# Patient Record
Sex: Female | Born: 2000 | Race: Black or African American | Hispanic: No | Marital: Single | Smoking: Never smoker
Health system: Southern US, Community
[De-identification: ages and names within clinical notes are randomized; demographics above are authoritative.]

## PROBLEM LIST (undated history)

## (undated) DIAGNOSIS — F419 Anxiety disorder, unspecified: Secondary | ICD-10-CM

## (undated) DIAGNOSIS — F909 Attention-deficit hyperactivity disorder, unspecified type: Secondary | ICD-10-CM

---
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
## 2020-07-10 ENCOUNTER — Emergency Department (HOSPITAL_COMMUNITY)
Admission: EM | Admit: 2020-07-10 | Discharge: 2020-07-10 | Disposition: A | Discharge status: Home / Self Care | Payer: Medicaid Other | Attending: Emergency Medicine | Admitting: Emergency Medicine

## 2020-07-10 ENCOUNTER — Other Ambulatory Visit: Payer: Self-pay

## 2020-07-10 ENCOUNTER — Encounter (HOSPITAL_COMMUNITY): Payer: Self-pay

## 2020-07-10 ENCOUNTER — Emergency Department (HOSPITAL_COMMUNITY): Payer: Medicaid Other

## 2020-07-10 DIAGNOSIS — M79644 Pain in right finger(s): Secondary | ICD-10-CM | POA: Diagnosis not present

## 2020-07-10 DIAGNOSIS — M79641 Pain in right hand: Secondary | ICD-10-CM | POA: Insufficient documentation

## 2020-07-10 DIAGNOSIS — W228XXA Striking against or struck by other objects, initial encounter: Secondary | ICD-10-CM | POA: Insufficient documentation

## 2020-07-10 DIAGNOSIS — Z5321 Procedure and treatment not carried out due to patient leaving prior to being seen by health care provider: Secondary | ICD-10-CM | POA: Insufficient documentation

## 2020-07-10 HISTORY — DX: Attention-deficit hyperactivity disorder, unspecified type: F90.9

## 2020-07-10 HISTORY — DX: Anxiety disorder, unspecified: F41.9

## 2020-07-10 NOTE — ED Triage Notes (Signed)
Pt coming in c/o hand pain to right hand pinkie finger after opening a package at work. Able to move finger slightly. Also c/o pain to right hand index finger after hitting hand on car.

## 2020-07-10 NOTE — ED Notes (Signed)
Pt turned in her labels and sts she is leaving 

## 2020-09-09 ENCOUNTER — Emergency Department (HOSPITAL_COMMUNITY)
Admission: EM | Admit: 2020-09-09 | Discharge: 2020-09-09 | Disposition: A | Discharge status: Home / Self Care | Payer: Medicaid Other | Attending: Emergency Medicine | Admitting: Emergency Medicine

## 2020-09-09 ENCOUNTER — Emergency Department (HOSPITAL_COMMUNITY): Payer: Medicaid Other

## 2020-09-09 ENCOUNTER — Encounter (HOSPITAL_COMMUNITY): Payer: Self-pay

## 2020-09-09 ENCOUNTER — Other Ambulatory Visit: Payer: Self-pay

## 2020-09-09 DIAGNOSIS — R110 Nausea: Secondary | ICD-10-CM | POA: Diagnosis not present

## 2020-09-09 DIAGNOSIS — R42 Dizziness and giddiness: Secondary | ICD-10-CM | POA: Insufficient documentation

## 2020-09-09 DIAGNOSIS — R0602 Shortness of breath: Secondary | ICD-10-CM | POA: Insufficient documentation

## 2020-09-09 LAB — COMPREHENSIVE METABOLIC PANEL
ALT: 13 U/L (ref 0–44)
AST: 15 U/L (ref 15–41)
Albumin: 4 g/dL (ref 3.5–5.0)
Alkaline Phosphatase: 81 U/L (ref 38–126)
Anion gap: 9 (ref 5–15)
BUN: 7 mg/dL (ref 6–20)
CO2: 23 mmol/L (ref 22–32)
Calcium: 9.6 mg/dL (ref 8.9–10.3)
Chloride: 104 mmol/L (ref 98–111)
Creatinine, Ser: 0.71 mg/dL (ref 0.44–1.00)
GFR, Estimated: >60 mL/min (ref >60)
Glucose, Bld: 87 mg/dL (ref 70–99)
Potassium: 3.9 mmol/L (ref 3.5–5.1)
Sodium: 136 mmol/L (ref 135–145)
Total Bilirubin: 1.4 mg/dL — ABNORMAL HIGH (ref 0.3–1.2)
Total Protein: 7.4 g/dL (ref 6.5–8.1)

## 2020-09-09 LAB — CBC
HCT: 39.8 % (ref 36.0–46.0)
Hemoglobin: 12.9 g/dL (ref 12.0–15.0)
MCH: 31.2 pg (ref 26.0–34.0)
MCHC: 32.4 g/dL (ref 30.0–36.0)
MCV: 96.4 fL (ref 80.0–100.0)
Platelets: 277 10*3/uL (ref 150–400)
RBC: 4.13 MIL/uL (ref 3.87–5.11)
RDW: 12.3 % (ref 11.5–15.5)
WBC: 5.2 10*3/uL (ref 4.0–10.5)
nRBC: 0 % (ref 0.0–0.2)

## 2020-09-09 LAB — URINALYSIS, ROUTINE W REFLEX MICROSCOPIC
Bilirubin Urine: NEGATIVE
Glucose, UA: NEGATIVE mg/dL
Hgb urine dipstick: NEGATIVE
Ketones, ur: NEGATIVE mg/dL
Leukocytes,Ua: NEGATIVE
Nitrite: NEGATIVE
Protein, ur: NEGATIVE mg/dL
Specific Gravity, Urine: 1.004 — ABNORMAL LOW (ref 1.005–1.030)
pH: 8 (ref 5.0–8.0)

## 2020-09-09 LAB — I-STAT BETA HCG BLOOD, ED (MC, WL, AP ONLY): I-stat hCG, quantitative: <5 m[IU]/mL (ref <5)

## 2020-09-09 LAB — LIPASE, BLOOD: Lipase: 32 U/L (ref 11–51)

## 2020-09-09 MED ORDER — PROMETHAZINE HCL 25 MG PO TABS: 25.0000 mg | ORAL_TABLET | Freq: Four times a day (QID) | ORAL | 0 refills | Status: AC | PRN

## 2020-09-09 MED ORDER — MECLIZINE HCL 25 MG PO TABS
50.0000 mg | ORAL_TABLET | Freq: Once | ORAL | Status: AC
  Administered 2020-09-09: 50 mg via ORAL
  Filled 2020-09-09: qty 2

## 2020-09-09 MED ORDER — SODIUM CHLORIDE 0.9 % IV BOLUS
1000.0000 mL | Freq: Once | INTRAVENOUS | Status: AC
  Administered 2020-09-09: 1000 mL via INTRAVENOUS

## 2020-09-09 MED ORDER — MECLIZINE HCL 25 MG PO TABS: 25.0000 mg | ORAL_TABLET | Freq: Three times a day (TID) | ORAL | 0 refills | Status: AC | PRN

## 2020-09-09 MED ORDER — PROMETHAZINE HCL 25 MG/ML IJ SOLN
25.0000 mg | Freq: Once | INTRAMUSCULAR | Status: AC
  Administered 2020-09-09: 25 mg via INTRAVENOUS
  Filled 2020-09-09: qty 1

## 2020-09-09 NOTE — ED Notes (Signed)
Patient verbalizes understanding of discharge instructions. Opportunity for questioning and answers were provided. Pt discharged from ED. 

## 2020-09-09 NOTE — ED Triage Notes (Addendum)
Pt reports she is here today due to sob, abd pain w/ N&V & dizziness. Pt reports that about x2 days. Pt reports that she is unaware if she is pregnant. Pt reports last menstrual cycle was end of feb.Pt reports she takes birth control & smoke THC

## 2020-09-09 NOTE — ED Notes (Signed)
Pt ambulated to bathroom with minimal assistance.

## 2020-09-09 NOTE — Discharge Instructions (Signed)
Please read and follow all provided instructions.  Your diagnoses today include:  1. Vertigo    Tests performed today include: Blood cell counts (white, red, and platelets) Electrolytes  Kidney function test Urine test to check for infection EKG Chest x-ray - shows size of heart that is the upper range of normal  Vital signs. See below for your results today.   Medications prescribed:   Meclizine - medication for vertigo   Phenergan (promethazine) - for nausea and vomiting  Take any prescribed medications only as directed.  Home care instructions:  Follow any educational materials contained in this packet.  BE VERY CAREFUL not to take multiple medicines containing Tylenol (also called acetaminophen). Doing so can lead to an overdose which can damage your liver and cause liver failure and possibly death.   Follow-up instructions: Please follow-up with your primary care provider in the next 3 days for further evaluation of your symptoms.   Return instructions:   Please return to the Emergency Department if you experience worsening symptoms.  Return if you have weakness in your arms or legs, slurred speech, trouble walking or talking, confusion, or trouble with your balance.   Please return if you have any other emergent concerns.  Additional Information:  Your vital signs today were: BP 126/83   Pulse (!) 48   Temp 98.2 F (36.8 C) (Oral)   Resp (!) 21   LMP 09/01/2020   SpO2 99%  If your blood pressure (BP) was elevated above 135/85 this visit, please have this repeated by your doctor within one month. --------------

## 2020-09-09 NOTE — ED Provider Notes (Signed)
[facility]   CSN: [id number] Arrival date & time: 09/09/20  [id number]     History Chief Complaint  Patient presents with  . Shortness of Breath  . Emesis    Ernst BreachLarissa Clawson is a 20 y.o. female.  Patient with history of anxiety presents to the ED today for several complaints.  Her main complaint is dizziness described as a spinning sensation that is worse with movement and associated with nausea, starting 2 days ago.  Symptoms began slowly stating that she just felt tired, however the dizziness gradually worsened.  She cannot tell me what she was doing when symptoms started.  She has had dry heaves but no full emesis.  She denies headache or head injury, denies other recent manipulations of the head or neck.  Patient denies signs other of stroke including: facial droop, slurred speech, aphasia, weakness/numbness in extremities.  Vertigo is improved with lying still but worse with movement of her head and with sitting up.  She has needed some assistance at times to go to the bathroom.  She reports some mild associated shortness of breath without cough or fever.  No chest pains.  She denies abdominal pain (contradicting triage note) or urinary symptoms to me.  She does endorse smoking THC.  She typically will take fluoxetine for anxiety but has not taken this in the past 2 days.         Past Medical History:  Diagnosis Date  . ADHD   . Anxiety     There are no problems to display for this patient.   History reviewed. No pertinent surgical history.   OB History   No obstetric history on file.     History reviewed. No pertinent family history.  Social History   Tobacco Use  . Smoking status: Never Smoker  . Smokeless tobacco: Never Used    Home Medications Prior to Admission medications   Medication Sig Start Date End Date Taking? Authorizing Provider  doxycycline (VIBRAMYCIN) 100 MG capsule Take 1 capsule (100 mg total)  by mouth 2 (two) times daily. 12/01/19   Derwood KaplanNanavati, Ankit, MD  ibuprofen (ADVIL) 600 MG tablet Take 1 tablet (600 mg total) by mouth every 6 (six) hours as needed. 11/16/19   Domenick GongMortenson, Ashley, MD  Lidocaine 4 % GEL Apply 1 application topically 3 (three) times daily as needed. Apply with swab. Use smallest effective amount.. Do not exceed 5 gm. 11/16/19   Domenick GongMortenson, Ashley, MD  naproxen (NAPROSYN) 500 MG tablet Take 1 tablet (500 mg total) by mouth 2 (two) times daily. 12/01/19   Derwood KaplanNanavati, Ankit, MD  nystatin ointment (MYCOSTATIN) Apply 1 application topically 2 (two) times daily. Until symptoms resolve 11/16/19   Domenick GongMortenson, Ashley, MD    Allergies    Patient has no known allergies.  Review of Systems   Review of Systems  Constitutional: Negative for chills and fever.  HENT: Negative for congestion, ear pain, rhinorrhea, sinus pressure and sore throat.   Eyes: Negative for photophobia and visual disturbance.  Respiratory: Positive for shortness of breath. Negative for cough.   Cardiovascular: Negative for chest pain.  Gastrointestinal: Positive for nausea. Negative for abdominal pain, diarrhea and vomiting.  Genitourinary: Negative for dysuria, frequency, hematuria and urgency.  Musculoskeletal: Positive for gait problem. Negative for myalgias, neck pain and neck stiffness.  Skin: Negative for rash.  Neurological: Positive for dizziness. Negative for syncope, speech difficulty, weakness, light-headedness, numbness and headaches.  Psychiatric/Behavioral: Negative for confusion.  Physical Exam Updated Vital Signs BP 127/66   Pulse 67   Temp 98.2 F (36.8 C) (Oral)   Resp 18   LMP 09/01/2020   SpO2 100%   Physical Exam Vitals and nursing note reviewed.  Constitutional:      General: She is not in acute distress.    Appearance: She is well-developed and well-nourished.  HENT:     Head: Normocephalic and atraumatic.     Right Ear: Tympanic membrane, ear canal and external ear  normal.     Left Ear: Tympanic membrane, ear canal and external ear normal.     Nose: Nose normal.     Mouth/Throat:     Mouth: Oropharynx is clear and moist and mucous membranes are normal.     Pharynx: Uvula midline.  Eyes:     General: Lids are normal.     Extraocular Movements: EOM normal.     Right eye: No nystagmus.     Left eye: No nystagmus.     Conjunctiva/sclera: Conjunctivae normal.     Pupils: Pupils are equal, round, and reactive to light.     Comments: Horizontal nystagmus present, fast phase to the left, worst with leftward gaze.   Cardiovascular:     Rate and Rhythm: Normal rate and regular rhythm.     Heart sounds: No murmur heard.   Pulmonary:     Effort: Pulmonary effort is normal. No respiratory distress.     Breath sounds: Normal breath sounds. No wheezing, rhonchi or rales.  Abdominal:     Palpations: Abdomen is soft.     Tenderness: There is no abdominal tenderness. There is no guarding or rebound.  Musculoskeletal:     Cervical back: Normal range of motion and neck supple. No tenderness or bony tenderness. Normal range of motion.     Right lower leg: No edema.     Left lower leg: No edema.  Skin:    General: Skin is warm and dry.     Findings: No rash.  Neurological:     General: No focal deficit present.     Mental Status: She is alert and oriented to person, place, and time. Mental status is at baseline.     GCS: GCS eye subscore is 4. GCS verbal subscore is 5. GCS motor subscore is 6.     Cranial Nerves: No cranial nerve deficit.     Sensory: No sensory deficit.     Motor: No weakness.     Coordination: She displays a negative Romberg sign. Coordination normal.     Gait: Gait normal.     Deep Tendon Reflexes: Strength normal and reflexes are normal and symmetric.  Psychiatric:        Mood and Affect: Mood and affect and mood normal.     ED Results / Procedures / Treatments   Labs (all labs ordered are listed, but only abnormal results are  displayed) Labs Reviewed  COMPREHENSIVE METABOLIC PANEL - Abnormal; Notable for the following components:      Result Value   Total Bilirubin 1.4 (*)    All other components within normal limits  URINALYSIS, ROUTINE W REFLEX MICROSCOPIC - Abnormal; Notable for the following components:   Color, Urine STRAW (*)    Specific Gravity, Urine 1.004 (*)    All other components within normal limits  LIPASE, BLOOD  CBC  I-STAT BETA HCG BLOOD, ED ([facility], [facility], [facility] ONLY)    ED ECG REPORT   Date: 09/09/2020  Rate: 70  Rhythm: normal sinus rhythm  QRS Axis: normal  Intervals: normal  ST/T Wave abnormalities: normal  Conduction Disutrbances:none  Narrative Interpretation: No prolonged QT, signs of WPW, Brugada syndrome, hypertrophy, block or arrhythmia.  Old EKG Reviewed: changes noted, no suggestion of RA enlargement today  I have personally reviewed the EKG tracing and agree with the computerized printout as noted.  Radiology DG Chest Portable 1 View  Result Date: 09/09/2020 CLINICAL DATA:  Shortness of breath, emesis, chest pain. EXAM: PORTABLE CHEST 1 VIEW COMPARISON:  None. FINDINGS: Borderline enlargement the cardiac silhouette. Both lungs are clear. No visible pleural effusions or pneumothorax. No acute osseous abnormality. IMPRESSION: 1. No evidence of acute cardiopulmonary disease. 2. Borderline cardiomegaly. Electronically Signed   By: Feliberto Harts MD   On: 09/09/2020 08:45    Procedures Procedures   Medications Ordered in ED Medications  meclizine (ANTIVERT) tablet 50 mg (50 mg Oral Given 09/09/20 0904)  promethazine (PHENERGAN) injection 25 mg (25 mg Intravenous Given 09/09/20 0904)  sodium chloride 0.9 % bolus 1,000 mL (0 mLs Intravenous Stopped 09/09/20 1118)    ED Course  I have reviewed the triage vital signs and the nursing notes.  Pertinent labs & imaging results that were available during my care of the patient were reviewed by me and considered in my medical decision  making (see chart for details).  Patient seen and examined. Work-up initiated by triage protocol. Medications ordered.   Vital signs reviewed and are as follows: BP 127/66   Pulse 67   Temp 98.2 F (36.8 C) (Oral)   Resp 18   LMP 09/01/2020   SpO2 100%   Patient's main problem today seems to be vertigo and other symptoms related to this such as nausea.  Will attempt to control vertigo.  At the current time this seems to be benign peripheral vertigo.  Symptoms started gradually and worsened.  No acute onset.  No associated strokelike symptoms.  Symptoms are definitely exacerbated with movement of the head and eyes.  11:34 AM CXR borderline cardiomegaly. EKG reviewed earlier and compared to old without concerning features or ischemia.  Patient rechecked.  She states that she is feeling better and her dizziness is resolved.  We will have her ambulate.  Anticipate discharge to home with meclizine if continued to be improved.   11:52 AM Pt has ambulated. Will d/c.   Patient counseled to return if they have weakness in their arms or legs, slurred speech, trouble walking or talking, confusion, trouble with their balance, or if they have any other concerns. Patient verbalizes understanding and agrees with plan.      MDM Rules/Calculators/A&P                          Vertigo: Likely benign peripheral vertigo given gradual onset.  Patient does not have neck pain or other neurologic symptoms to suspect vascular dissection or stroke.  Symptoms are exacerbated with movement and position and better at rest.  I do not feel that the patient requires advanced imaging with CT angiography or MRI today.  She is given strict return instructions as discussed above.  Labs and work-up reassuring.  Heart size was upper limit of normal on chest x-ray without any concerning EKG findings.  Dizziness is prolonged and reproducible and I do not suspect a transient arrhythmia as the cause.  Doubt cardiac etiology as  she denies lightheadedness or near syncope.  No associated chest pain or shortness of breath  to suggest PE or ACS.  She looks well.   Final Clinical Impression(s) / ED Diagnoses Final diagnoses:  Vertigo    Rx / DC Orders ED Discharge Orders         Ordered    meclizine (ANTIVERT) 25 MG tablet  3 times daily PRN        09/09/20 1156    promethazine (PHENERGAN) 25 MG tablet  Every 6 hours PRN        09/09/20 1156           Renne Crigler, PA-C 09/09/20 1157    Milagros Loll, MD 09/12/20 1840

## 2022-07-24 IMAGING — CR DG HAND COMPLETE 3+V*R*
3 series · 3 of 3 positions shown · non-contrast
Comparison: None.

CLINICAL DATA: Pain following injury

EXAM:
RIGHT HAND - COMPLETE 3+ VIEW

[x hand pa right]
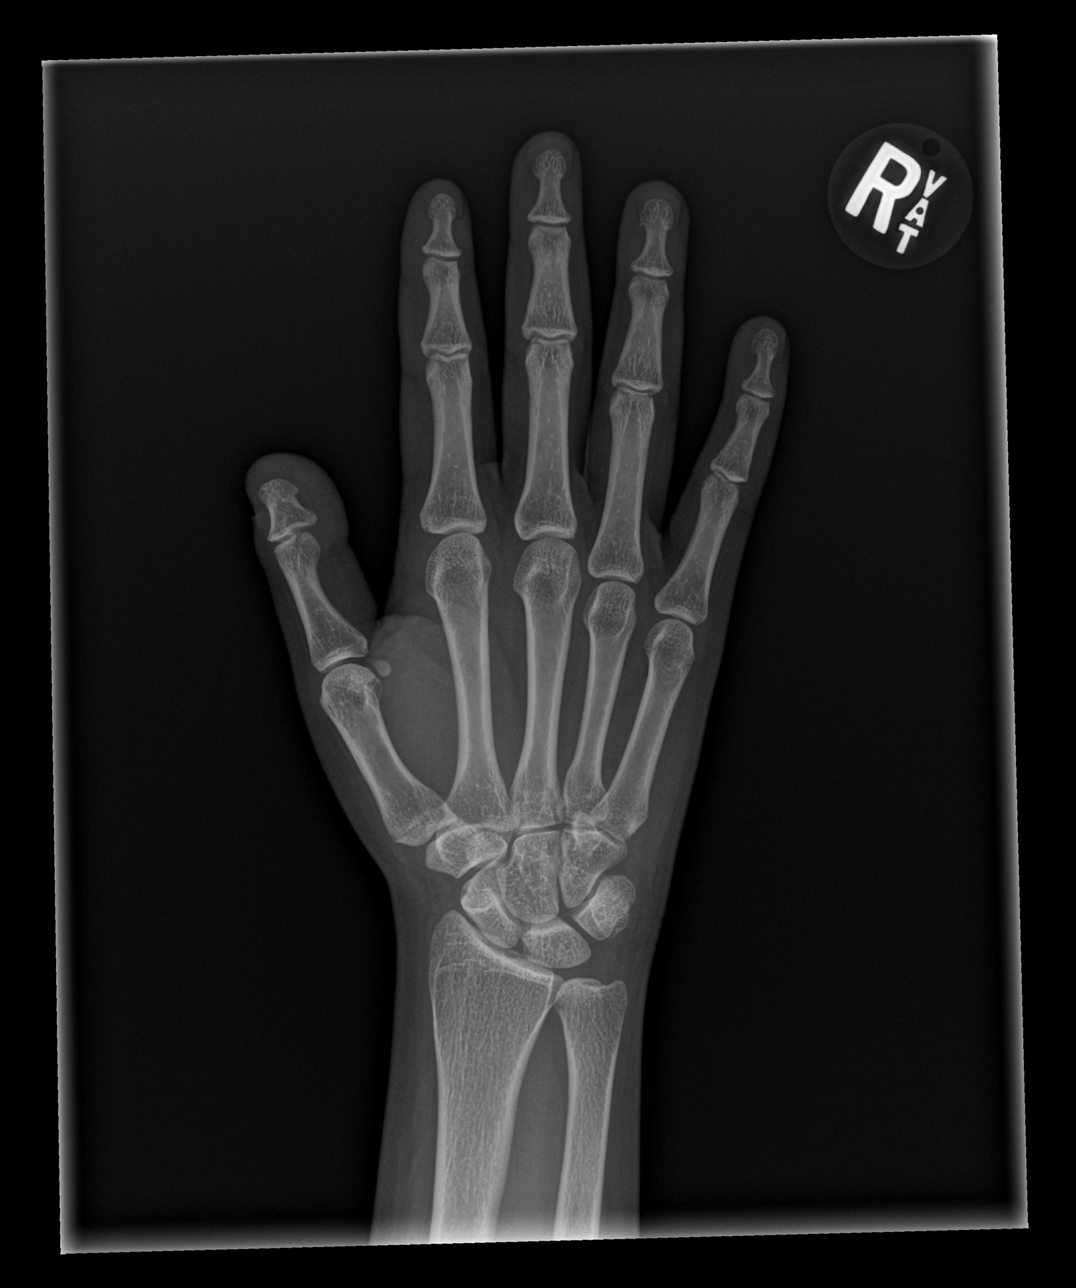

[x hand obl right]
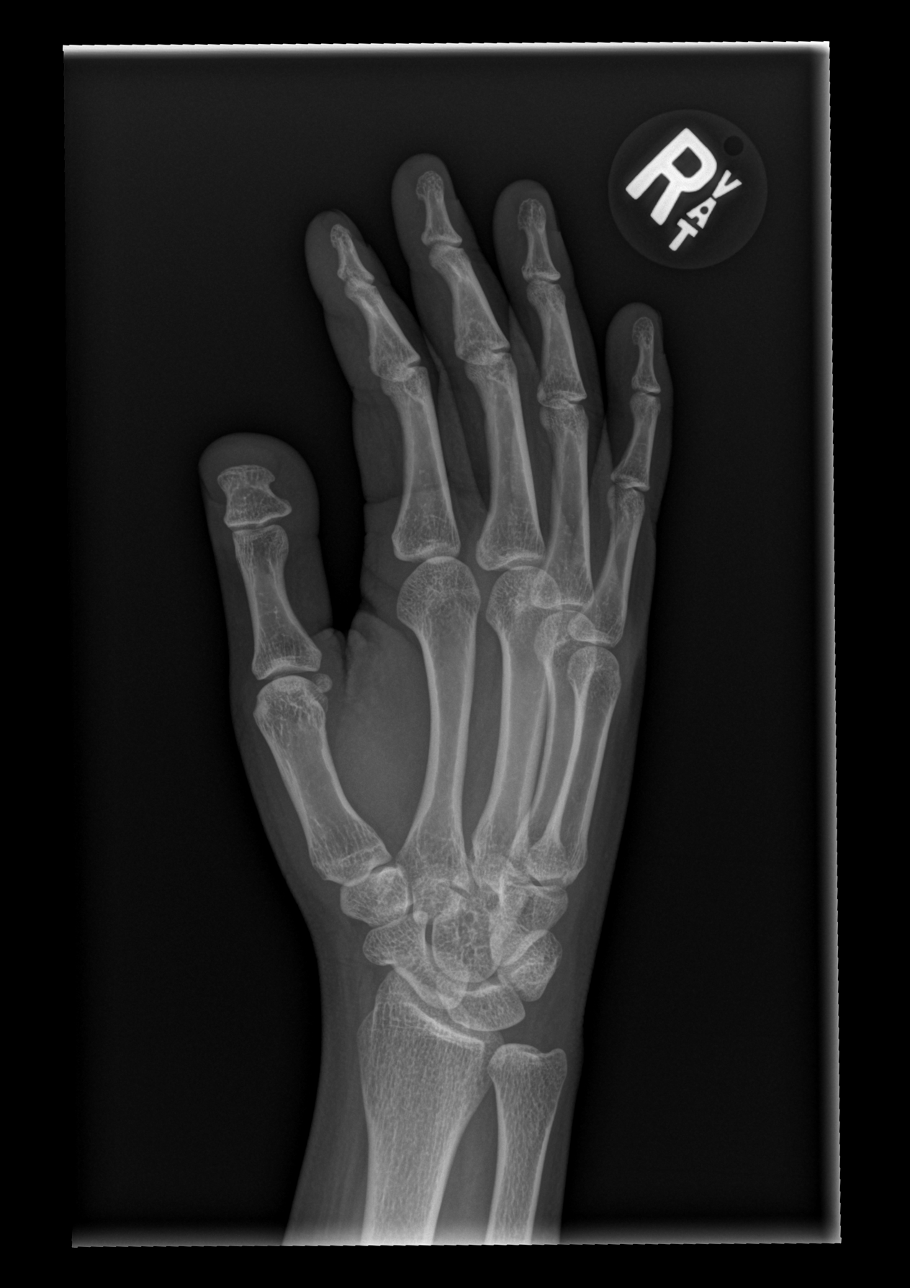

[x hand lat right]
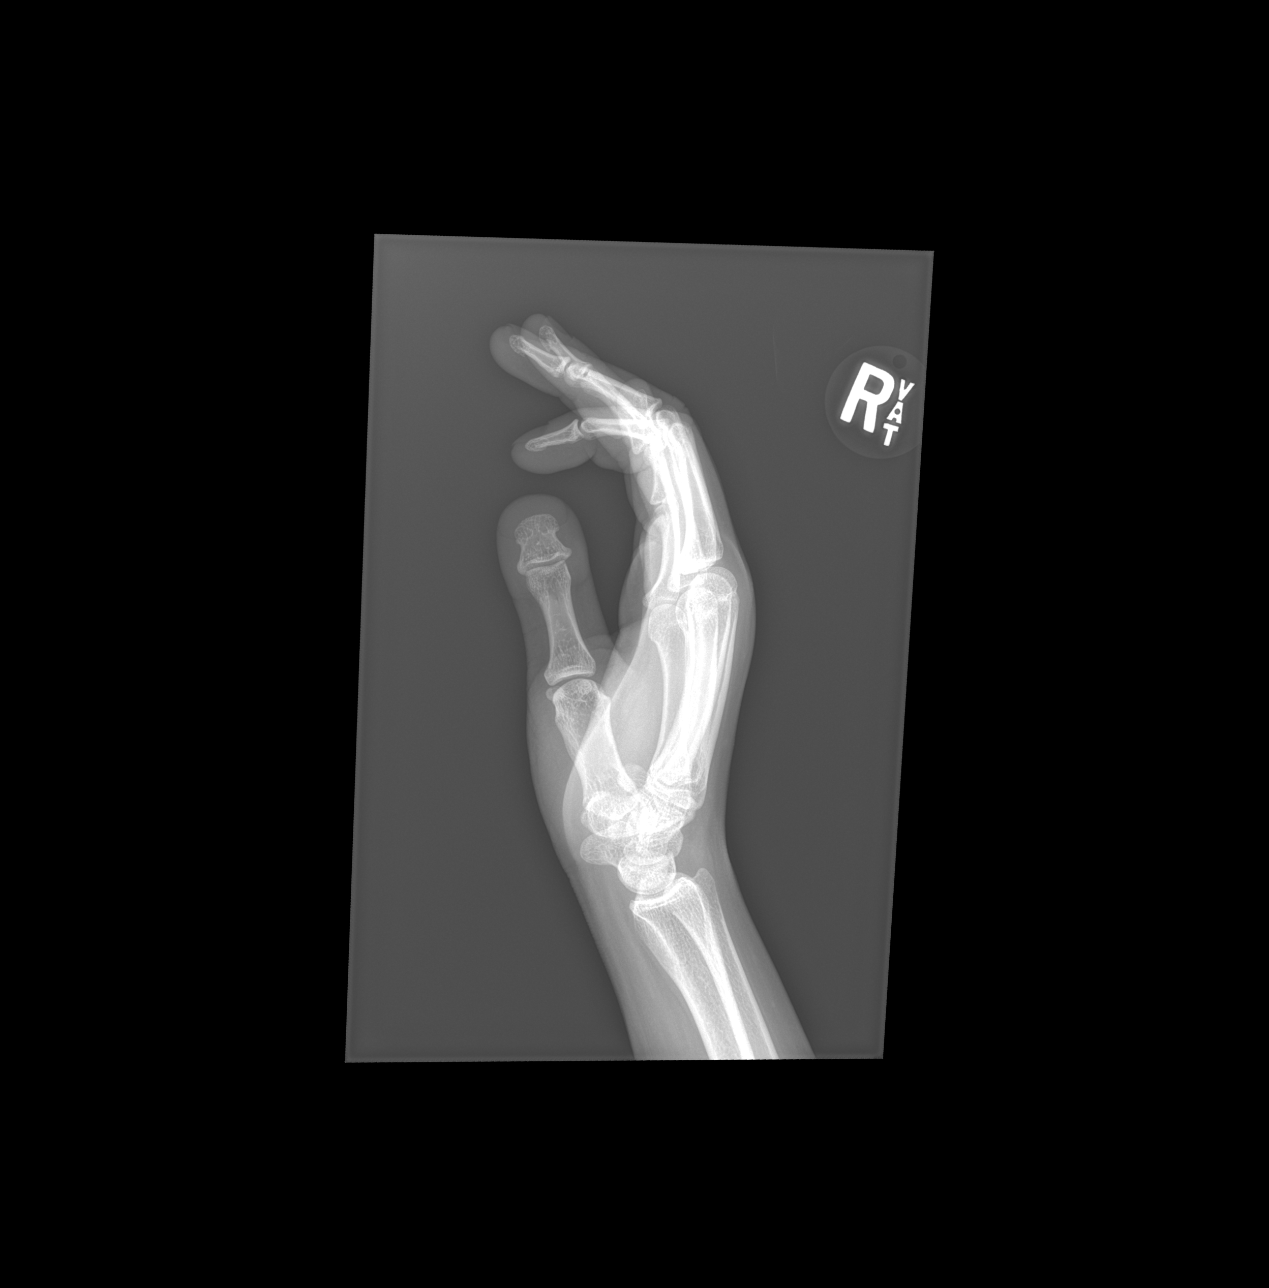

[3 of 3 positions shown; findings below may reference images not displayed]

FINDINGS: Frontal, oblique, and lateral views were obtained. No appreciable
fracture or dislocation. Joint spaces appear normal. No erosive
changes.
IMPRESSION: No fracture or dislocation.  No evident arthropathy.
